# Patient Record
Sex: Female | Born: 1970
Health system: Southern US, Community
[De-identification: ages and names within clinical notes are randomized; demographics above are authoritative.]

## PROBLEM LIST (undated history)

## (undated) HISTORY — PX: WISDOM TOOTH EXTRACTION: SHX21

---

## 2001-03-05 ENCOUNTER — Emergency Department (HOSPITAL_COMMUNITY): Admission: EM | Admit: 2001-03-05 | Discharge: 2001-03-05 | Payer: Self-pay | Admitting: Emergency Medicine

## 2001-04-14 ENCOUNTER — Other Ambulatory Visit: Admission: RE | Admit: 2001-04-14 | Discharge: 2001-04-14 | Payer: Self-pay | Admitting: Obstetrics and Gynecology

## 2001-08-21 ENCOUNTER — Encounter: Payer: Self-pay | Admitting: Obstetrics and Gynecology

## 2001-08-21 ENCOUNTER — Ambulatory Visit (HOSPITAL_COMMUNITY): Admission: RE | Admit: 2001-08-21 | Discharge: 2001-08-21 | Payer: Self-pay | Admitting: Obstetrics and Gynecology

## 2001-12-10 ENCOUNTER — Other Ambulatory Visit: Admission: RE | Admit: 2001-12-10 | Discharge: 2001-12-10 | Payer: Self-pay | Admitting: Obstetrics and Gynecology

## 2002-06-01 ENCOUNTER — Inpatient Hospital Stay (HOSPITAL_COMMUNITY): Admission: AD | Admit: 2002-06-01 | Discharge: 2002-06-01 | Payer: Self-pay | Admitting: Obstetrics and Gynecology

## 2002-06-02 ENCOUNTER — Inpatient Hospital Stay (HOSPITAL_COMMUNITY): Admission: AD | Admit: 2002-06-02 | Discharge: 2002-06-02 | Payer: Self-pay | Admitting: *Deleted

## 2002-07-18 ENCOUNTER — Inpatient Hospital Stay (HOSPITAL_COMMUNITY): Admission: AD | Admit: 2002-07-18 | Discharge: 2002-07-20 | Payer: Self-pay | Admitting: Obstetrics and Gynecology

## 2002-08-16 ENCOUNTER — Other Ambulatory Visit: Admission: RE | Admit: 2002-08-16 | Discharge: 2002-08-16 | Payer: Self-pay | Admitting: Obstetrics and Gynecology

## 2006-06-07 ENCOUNTER — Emergency Department (HOSPITAL_COMMUNITY): Admission: EM | Admit: 2006-06-07 | Discharge: 2006-06-07 | Payer: Self-pay | Admitting: Emergency Medicine

## 2008-08-14 IMAGING — CR DG ABDOMEN ACUTE W/ 1V CHEST
3 series · 3 of 3 positions shown · non-contrast
Comparison: none

CLINICAL DATA: Abdominal  pain. Nausea and vomiting. 
 ACUTE ABDOMINAL SERIES:

[w chest pa]
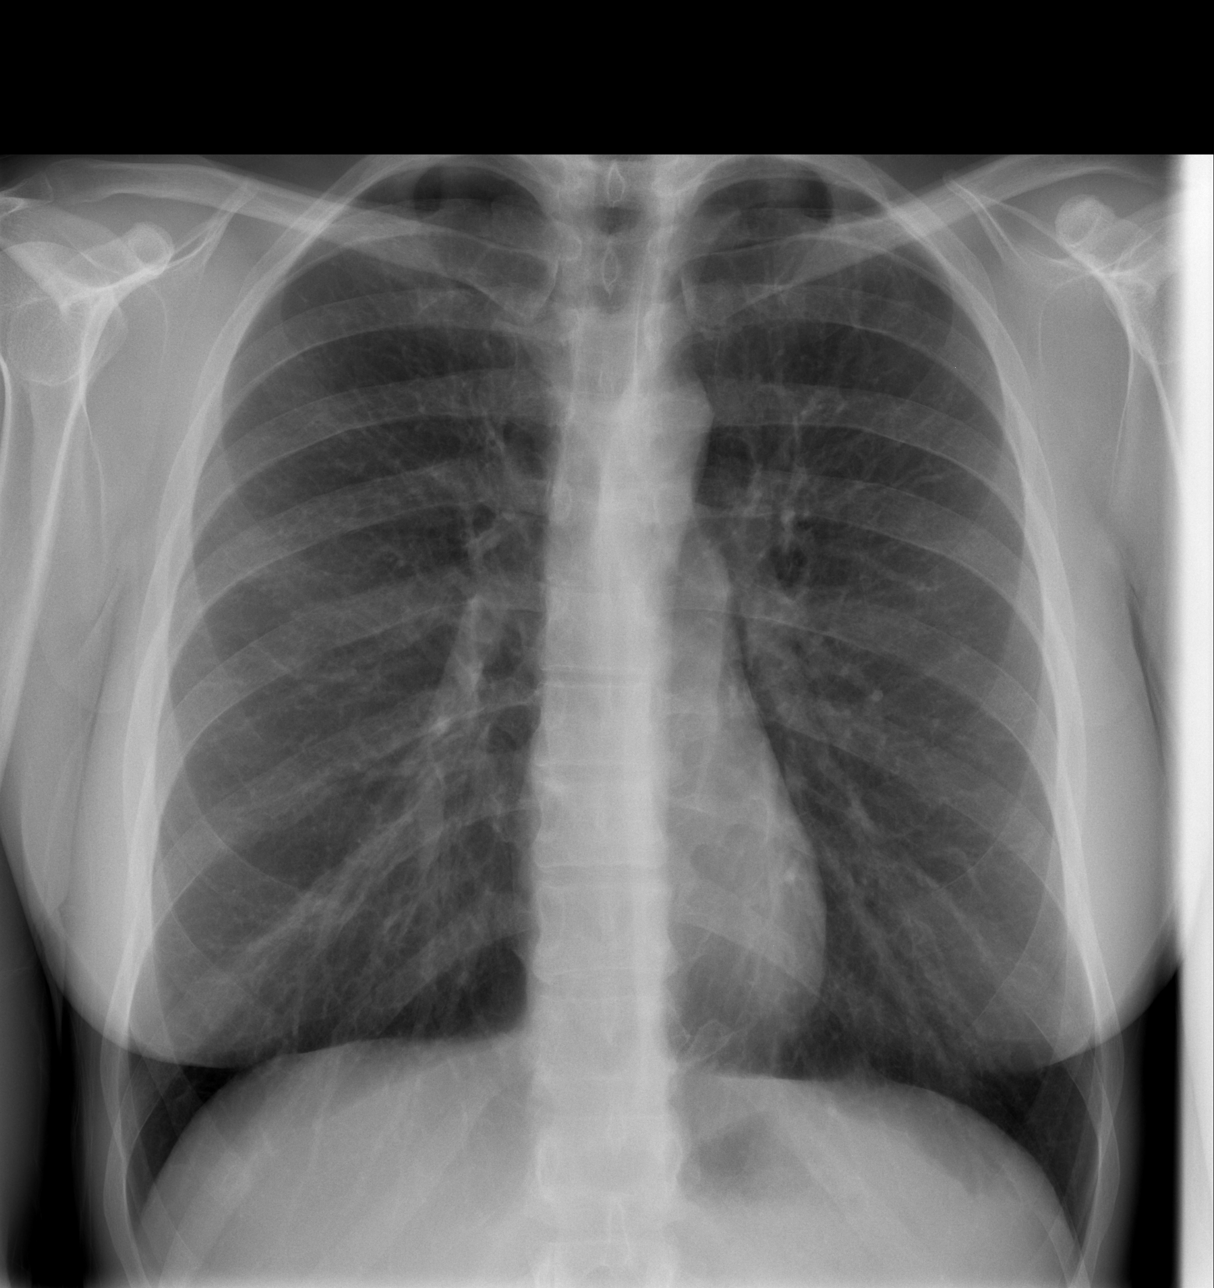

[w abdomen upright]
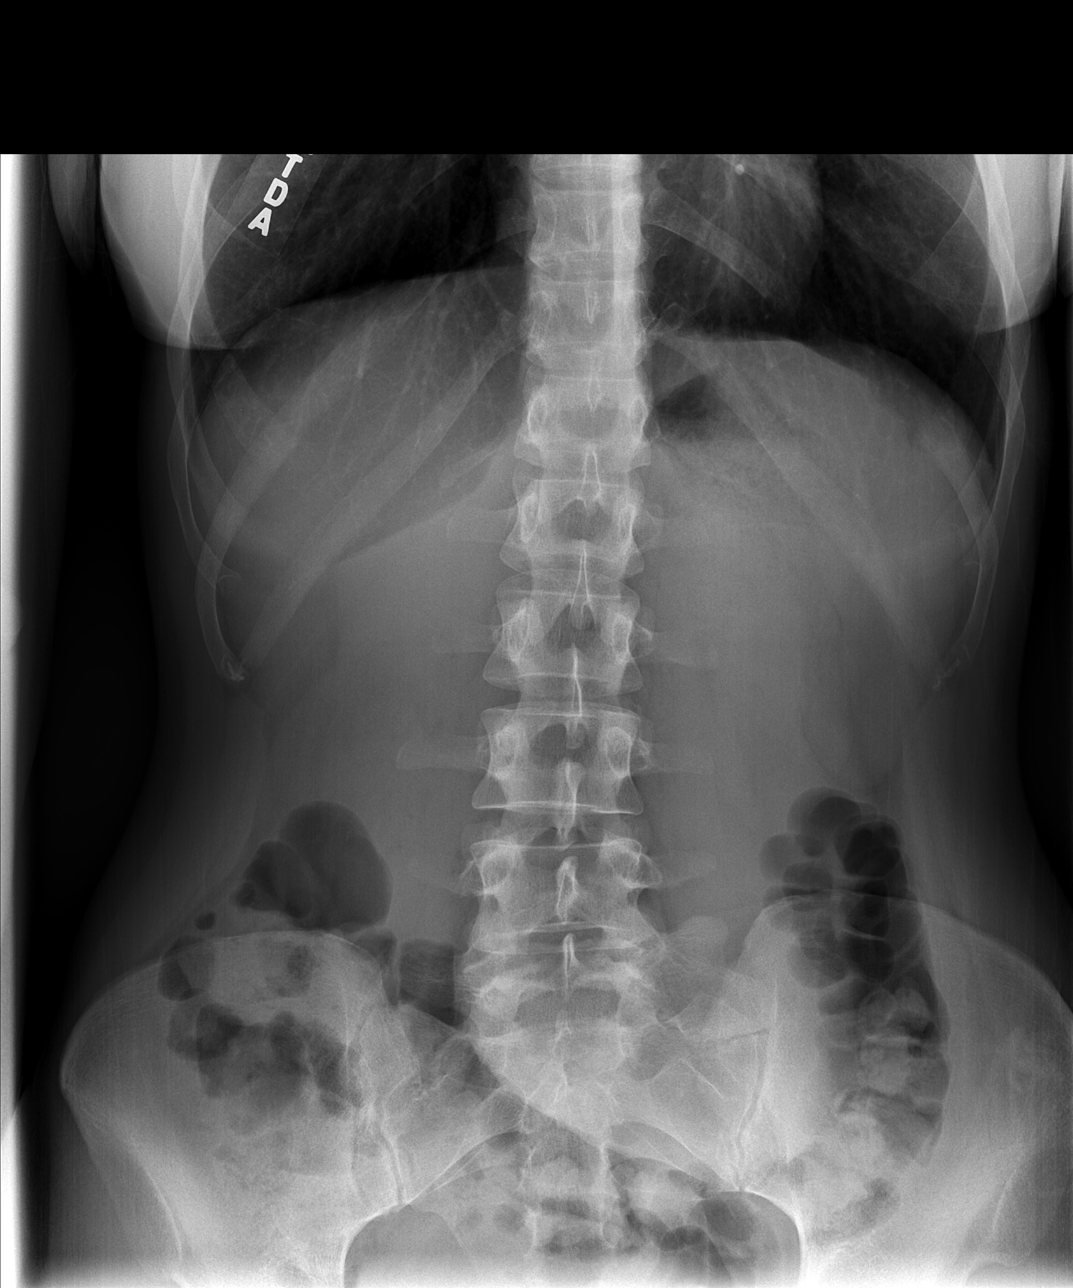

[t abdomen supine]
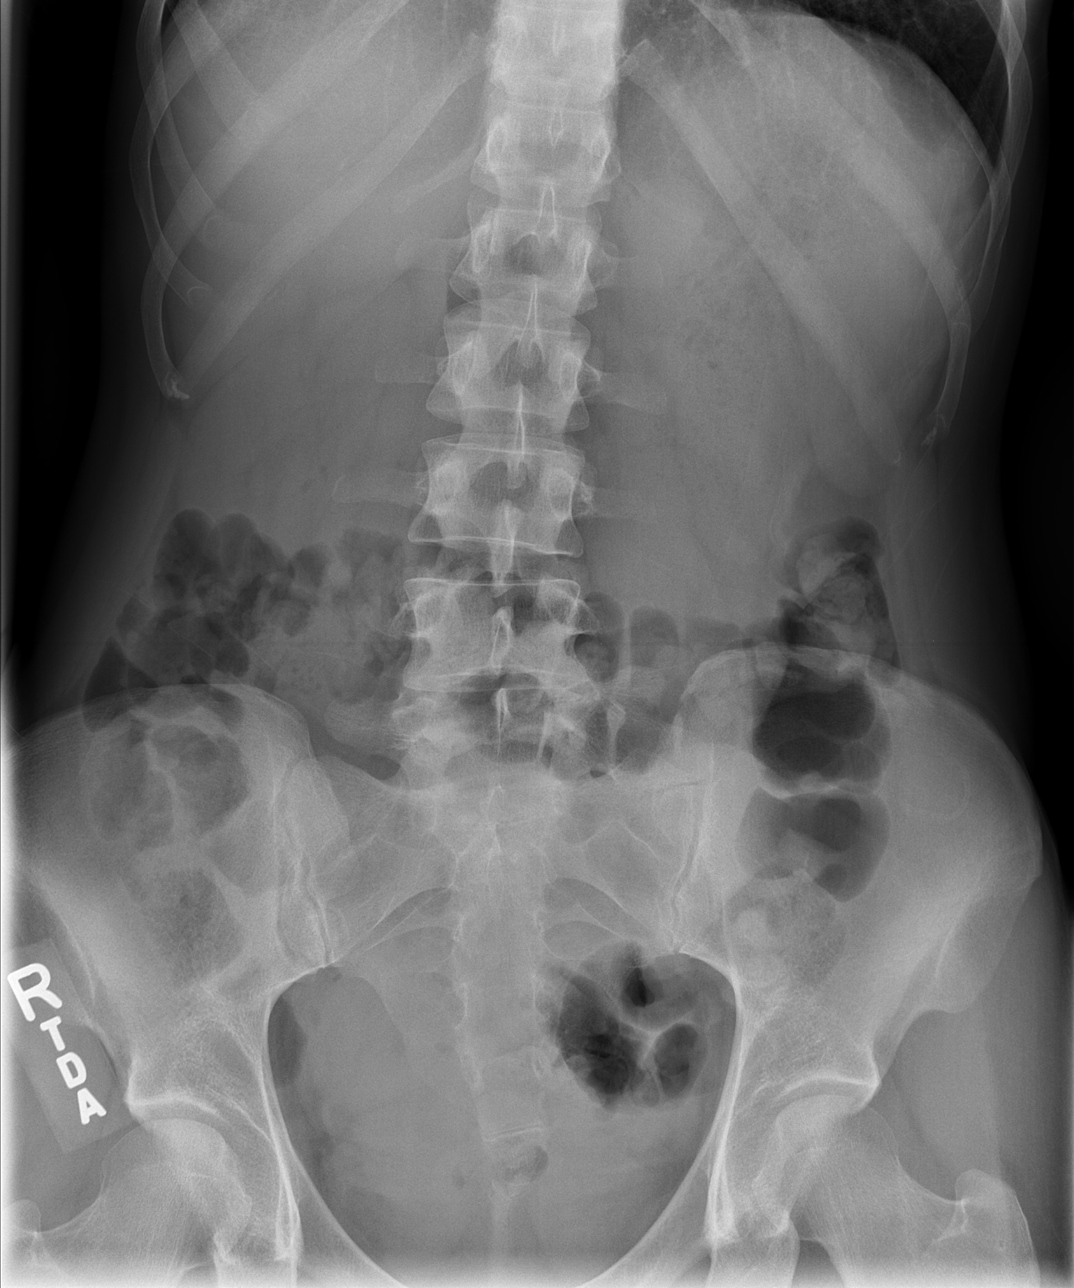

[3 of 3 positions shown; findings below may reference images not displayed]

FINDINGS: There is no evidence of dilated bowel loops or free intraperitoneal air.  No radiopaque calculi or other significant radiographic abnormality is seen.
 Heart size and mediastinal contours are within normal limits.  Both lungs are clear.
IMPRESSION: Negative abdominal radiographs.  No acute cardiopulmonary disease.

## 2010-06-08 NOTE — Op Note (Signed)
   NAME:  Megan Schmidt, Megan Schmidt                          ACCOUNT NO.:  [id number]   MEDICAL RECORD NO.:  [id number]                   PATIENT TYPE:  INP   LOCATION:  9129                                 FACILITY:  [facility]   PHYSICIAN:  Lenoard Aden, M.D.             DATE OF BIRTH:  25-Mar-1970   DATE OF PROCEDURE:  07/18/2002  DATE OF DISCHARGE:                                 OPERATIVE REPORT   OPERATIVE DELIVERY NOTE:   INDICATION FOR OPERATIVE DELIVERY:  Nonreassuring fetal heart rate tracing.   POSTOPERATIVE DIAGNOSIS:  Nonreassuring fetal heart rate tracing.   PROCEDURE:  Outlet vacuum-assisted vaginal delivery.   SURGEON:  Lenoard Aden, M.D.   ANESTHESIA:  Epidural.   ESTIMATED BLOOD LOSS:  700 mL.   DESCRIPTION OF PROCEDURE:  After pushing x45 minutes, severe variable  decelerations were noted in the 70-80 beat per minute range.  The Kiwi cup  is explained, risks of cephalohematoma, scalp laceration, and small  incidence of intracranial hemorrhage discussed with the patient and her  husband, and they comply and wish to proceed with the procedure.  A Kiwi cup  is placed in the proper position for four pulls with one pop-off, of a full-  term living female, Apgars 8 and 10, over a central median episiotomy with  third degree extension and bilateral lateral vaginal wall lacerations,  Apgars 8 and 10 as noted.  The placenta delivered spontaneously intact,  three-vessel cord noted, estimated blood loss as noted was 700 mL.  Repair  with a 3-0 Rapide in a continuous running fashion on each of the lateral  wall lacerations, brought down to the edge of the perineum.  Standard  episiotomy repair was then performed with a 3-0 Rapide and 0 Vicryl placed  in interrupted fashion for sphincter repair in an end-to-end fashion.  Mother and baby tolerated the procedure well.  Patient to recovery in good  condition.                                               Lenoard Aden,  M.D.    RJT/MEDQ  D:  07/18/2002  T:  07/19/2002  Job:  [id number]

## 2010-06-08 NOTE — H&P (Signed)
   NAME:  Megan Schmidt, Megan Schmidt                          ACCOUNT NO.:  [id number]   MEDICAL RECORD NO.:  [id number]                   PATIENT TYPE:  INP   LOCATION:  9167                                 FACILITY:  [facility]   PHYSICIAN:  Lenoard Aden, M.D.             DATE OF BIRTH:  Jan 05, 1971   DATE OF ADMISSION:  07/18/2002  DATE OF DISCHARGE:                                HISTORY & PHYSICAL   CHIEF COMPLAINT:  Postdates induction and presumed LGA.   HISTORY OF PRESENT ILLNESS:  A 40 year old white female, gravida 1, para 0,  EDD July 17, 2002, at 40-1/7 weeks who presents for induction for  aforementioned reasons.   ALLERGIES:  No known drug allergies.   MEDICATIONS:  Prenatal vitamins.   PAST MEDICAL HISTORY:  History of Chlamydia at age 19.  History of  hospitalization for ear infection.   PAST OBSTETRICAL HISTORY:  Noncontributory.   FAMILY HISTORY:  Myocardial infarction and diabetes.   No other medical or surgical hospitalizations noted.   PRENATAL LABORATORY DATA:  Blood type A positive, Rh negative, rubella  immune, hepatitis negative, HIV declined. Pregnancy complicated by preterm  cervical change, no evidence of worsening preterm labor.   PHYSICAL EXAMINATION:  GENERAL: She is a well-developed, well-nourished  white female in no acute distress.  HEENT:  Normal.  LUNGS:  Clear.  HEART:  Regular rate and rhythm.  ABDOMEN:  Soft, gravid, and nontender.  Estimated fetal weight by ultrasound  8-1/2 pounds.  PELVIC:  Cervix is 2 to 3 cm, 80 to 90% effaced, and vertex at -1.  EXTREMITIES:  No cords.  NEUROLOGY:  Nonfocal.   IMPRESSION:  1. 40+ week intrauterine pregnancy.  2. Presumed large for gestational age.    PLAN:  To proceed with attempts at vaginal delivery, Pitocin p.r.n.,  epidural, and IBR narcotics as needed.  Anticipate attempts at vaginal  delivery.                                               Lenoard Aden, M.D.    RJT/MEDQ  D:  07/18/2002   T:  07/18/2002  Job:  [id number]

## 2011-10-30 ENCOUNTER — Other Ambulatory Visit: Payer: Self-pay | Admitting: Obstetrics and Gynecology

## 2011-10-30 DIAGNOSIS — Z1231 Encounter for screening mammogram for malignant neoplasm of breast: Secondary | ICD-10-CM

## 2011-11-26 ENCOUNTER — Ambulatory Visit
Admission: RE | Admit: 2011-11-26 | Discharge: 2011-11-26 | Disposition: A | Discharge status: Home / Self Care | Payer: BC Managed Care – PPO | Source: Ambulatory Visit | Attending: Obstetrics and Gynecology | Admitting: Obstetrics and Gynecology

## 2011-11-26 ENCOUNTER — Other Ambulatory Visit: Payer: Self-pay | Admitting: Obstetrics and Gynecology

## 2011-11-26 DIAGNOSIS — Z1231 Encounter for screening mammogram for malignant neoplasm of breast: Secondary | ICD-10-CM

## 2017-03-10 DIAGNOSIS — Z01419 Encounter for gynecological examination (general) (routine) without abnormal findings: Secondary | ICD-10-CM | POA: Diagnosis not present

## 2017-03-10 DIAGNOSIS — Z1231 Encounter for screening mammogram for malignant neoplasm of breast: Secondary | ICD-10-CM | POA: Diagnosis not present

## 2017-03-10 DIAGNOSIS — Z1321 Encounter for screening for nutritional disorder: Secondary | ICD-10-CM | POA: Diagnosis not present

## 2017-03-10 DIAGNOSIS — Z13220 Encounter for screening for lipoid disorders: Secondary | ICD-10-CM | POA: Diagnosis not present

## 2017-03-10 DIAGNOSIS — Z130 Encounter for screening for diseases of the blood and blood-forming organs and certain disorders involving the immune mechanism: Secondary | ICD-10-CM | POA: Diagnosis not present

## 2017-03-10 DIAGNOSIS — Z6823 Body mass index (BMI) 23.0-23.9, adult: Secondary | ICD-10-CM | POA: Diagnosis not present

## 2017-03-10 DIAGNOSIS — Z131 Encounter for screening for diabetes mellitus: Secondary | ICD-10-CM | POA: Diagnosis not present

## 2017-03-10 DIAGNOSIS — Z1151 Encounter for screening for human papillomavirus (HPV): Secondary | ICD-10-CM | POA: Diagnosis not present

## 2017-03-10 DIAGNOSIS — Z1329 Encounter for screening for other suspected endocrine disorder: Secondary | ICD-10-CM | POA: Diagnosis not present

## 2018-08-10 DIAGNOSIS — J310 Chronic rhinitis: Secondary | ICD-10-CM | POA: Diagnosis not present

## 2018-08-10 DIAGNOSIS — H9202 Otalgia, left ear: Secondary | ICD-10-CM | POA: Diagnosis not present

## 2018-08-10 DIAGNOSIS — J342 Deviated nasal septum: Secondary | ICD-10-CM | POA: Diagnosis not present

## 2018-10-14 DIAGNOSIS — Z1329 Encounter for screening for other suspected endocrine disorder: Secondary | ICD-10-CM | POA: Diagnosis not present

## 2018-10-14 DIAGNOSIS — Z0000 Encounter for general adult medical examination without abnormal findings: Secondary | ICD-10-CM | POA: Diagnosis not present

## 2018-10-14 DIAGNOSIS — Z6821 Body mass index (BMI) 21.0-21.9, adult: Secondary | ICD-10-CM | POA: Diagnosis not present

## 2018-10-14 DIAGNOSIS — Z1231 Encounter for screening mammogram for malignant neoplasm of breast: Secondary | ICD-10-CM | POA: Diagnosis not present

## 2018-10-14 DIAGNOSIS — Z13220 Encounter for screening for lipoid disorders: Secondary | ICD-10-CM | POA: Diagnosis not present

## 2018-10-14 DIAGNOSIS — Z130 Encounter for screening for diseases of the blood and blood-forming organs and certain disorders involving the immune mechanism: Secondary | ICD-10-CM | POA: Diagnosis not present

## 2018-10-14 DIAGNOSIS — Z01419 Encounter for gynecological examination (general) (routine) without abnormal findings: Secondary | ICD-10-CM | POA: Diagnosis not present

## 2018-11-18 DIAGNOSIS — R748 Abnormal levels of other serum enzymes: Secondary | ICD-10-CM | POA: Diagnosis not present

## 2019-03-27 DIAGNOSIS — S92335A Nondisplaced fracture of third metatarsal bone, left foot, initial encounter for closed fracture: Secondary | ICD-10-CM | POA: Diagnosis not present

## 2019-11-02 DIAGNOSIS — Z6821 Body mass index (BMI) 21.0-21.9, adult: Secondary | ICD-10-CM | POA: Diagnosis not present

## 2019-11-02 DIAGNOSIS — Z1231 Encounter for screening mammogram for malignant neoplasm of breast: Secondary | ICD-10-CM | POA: Diagnosis not present

## 2019-11-02 DIAGNOSIS — Z01419 Encounter for gynecological examination (general) (routine) without abnormal findings: Secondary | ICD-10-CM | POA: Diagnosis not present

## 2019-11-02 DIAGNOSIS — R7989 Other specified abnormal findings of blood chemistry: Secondary | ICD-10-CM | POA: Diagnosis not present

## 2020-11-02 ENCOUNTER — Encounter: Payer: Self-pay | Admitting: Internal Medicine

## 2020-12-12 ENCOUNTER — Ambulatory Visit (AMBULATORY_SURGERY_CENTER): Payer: Self-pay

## 2020-12-12 ENCOUNTER — Other Ambulatory Visit: Payer: Self-pay

## 2020-12-12 VITALS — Ht 66.0 in | Wt 124.0 lb

## 2020-12-12 DIAGNOSIS — Z1211 Encounter for screening for malignant neoplasm of colon: Secondary | ICD-10-CM

## 2020-12-12 MED ORDER — NA SULFATE-K SULFATE-MG SULF 17.5-3.13-1.6 GM/177ML PO SOLN: 1.0000 | Freq: Once | ORAL | 0 refills | Status: AC

## 2020-12-12 MED ORDER — NA SULFATE-K SULFATE-MG SULF 17.5-3.13-1.6 GM/177ML PO SOLN: 1.0000 | Freq: Once | ORAL | 0 refills | Status: DC

## 2020-12-12 NOTE — Progress Notes (Signed)
Denies allergies to eggs or soy products. Denies complication of anesthesia or sedation. Denies use of weight loss medication. Denies use of O2.   Emmi instructions given for colonoscopy.  

## 2020-12-19 ENCOUNTER — Encounter: Payer: Self-pay | Admitting: Internal Medicine

## 2020-12-20 ENCOUNTER — Telehealth: Payer: Self-pay | Admitting: Internal Medicine

## 2020-12-20 DIAGNOSIS — Z1211 Encounter for screening for malignant neoplasm of colon: Secondary | ICD-10-CM

## 2020-12-20 MED ORDER — NA SULFATE-K SULFATE-MG SULF 17.5-3.13-1.6 GM/177ML PO SOLN: 1.0000 | ORAL | 0 refills | Status: DC

## 2020-12-20 NOTE — Telephone Encounter (Signed)
Resent suprep to pt's pharmacy and prep instructions sent to pt's email-per pt's request. Pt is aware.

## 2020-12-26 ENCOUNTER — Other Ambulatory Visit: Payer: Self-pay

## 2020-12-26 ENCOUNTER — Encounter: Payer: Self-pay | Admitting: Internal Medicine

## 2020-12-26 ENCOUNTER — Ambulatory Visit (AMBULATORY_SURGERY_CENTER): Payer: BC Managed Care – PPO | Admitting: Internal Medicine

## 2020-12-26 VITALS — BP 107/77 | HR 65 | Temp 97.3°F | Resp 14 | Ht 66.0 in | Wt 124.0 lb

## 2020-12-26 DIAGNOSIS — Z1211 Encounter for screening for malignant neoplasm of colon: Secondary | ICD-10-CM | POA: Diagnosis present

## 2020-12-26 MED ORDER — SODIUM CHLORIDE 0.9 % IV SOLN: 500.0000 mL | Freq: Once | INTRAVENOUS | Status: AC

## 2020-12-26 NOTE — Op Note (Signed)
[facility] Patient Name: Megan Schmidt Procedure Date: 12/26/2020 10:46 AM MRN: [id number] Endoscopist: Beverley Fiedler , MD Age: 50 Referring MD:  Date of Birth: Feb 07, 1970 Gender: Female Account #: [id number] Procedure:                Colonoscopy Indications:              Screening for colorectal malignant neoplasm Medicines:                Monitored Anesthesia Care Procedure:                Pre-Anesthesia Assessment:                           - Prior to the procedure, a History and Physical                            was performed, and patient medications and                            allergies were reviewed. The patient's tolerance of                            previous anesthesia was also reviewed. The risks                            and benefits of the procedure and the sedation                            options and risks were discussed with the patient.                            All questions were answered, and informed consent                            was obtained. Prior Anticoagulants: The patient has                            taken no previous anticoagulant or antiplatelet                            agents. ASA Grade Assessment: I - A normal, healthy                            patient. After reviewing the risks and benefits,                            the patient was deemed in satisfactory condition to                            undergo the procedure.                           After obtaining informed consent, the colonoscope  was passed under direct vision. Throughout the                            procedure, the patient's blood pressure, pulse, and                            oxygen saturations were monitored continuously. The                            PCF-HQ190L Colonoscope was introduced through the                            anus and advanced to the cecum, identified by                            appendiceal orifice and ileocecal  valve. The                            colonoscopy was performed without difficulty. The                            patient tolerated the procedure well. The quality                            of the bowel preparation was excellent. The                            ileocecal valve, appendiceal orifice, and rectum                            were photographed. Scope In: 11:01:23 AM Scope Out: 11:13:06 AM Scope Withdrawal Time: 0 hours 7 minutes 56 seconds  Total Procedure Duration: 0 hours 11 minutes 43 seconds  Findings:                 The digital rectal exam was normal.                           The colon (entire examined portion) appeared normal.                           Internal hemorrhoids were found during                            retroflexion. The hemorrhoids were small. Complications:            No immediate complications. Estimated Blood Loss:     Estimated blood loss: none. Impression:               - The entire examined colon is normal.                           - Small internal hemorrhoids.                           - No specimens collected. Recommendation:           -  Patient has a contact number available for                            emergencies. The signs and symptoms of potential                            delayed complications were discussed with the                            patient. Return to normal activities tomorrow.                            Written discharge instructions were provided to the                            patient.                           - Resume previous diet.                           - Continue present medications.                           - Repeat colonoscopy in 10 years for screening                            purposes. Beverley Fiedler, MD 12/26/2020 11:16:22 AM This report has been signed electronically.

## 2020-12-26 NOTE — Patient Instructions (Signed)
YOU HAD AN ENDOSCOPIC PROCEDURE TODAY AT THE  ENDOSCOPY CENTER:   Refer to the procedure report that was given to you for any specific questions about what was found during the examination.  If the procedure report does not answer your questions, please call your gastroenterologist to clarify.  If you requested that your care partner not be given the details of your procedure findings, then the procedure report has been included in a sealed envelope for you to review at your convenience later.  YOU SHOULD EXPECT: Some feelings of bloating in the abdomen. Passage of more gas than usual.  Walking can help get rid of the air that was put into your GI tract during the procedure and reduce the bloating. If you had a lower endoscopy (such as a colonoscopy or flexible sigmoidoscopy) you may notice spotting of blood in your stool or on the toilet paper. If you underwent a bowel prep for your procedure, you may not have a normal bowel movement for a few days.  Please Note:  You might notice some irritation and congestion in your nose or some drainage.  This is from the oxygen used during your procedure.  There is no need for concern and it should clear up in a day or so.  SYMPTOMS TO REPORT IMMEDIATELY:  Following lower endoscopy (colonoscopy or flexible sigmoidoscopy):  Excessive amounts of blood in the stool  Significant tenderness or worsening of abdominal pains  Swelling of the abdomen that is new, acute  Fever of 100F or higher   For urgent or emergent issues, a gastroenterologist can be reached at any hour by calling [phone]. Do not use MyChart messaging for urgent concerns.    DIET:  We do recommend a small meal at first, but then you may proceed to your regular diet.  Drink plenty of fluids but you should avoid alcoholic beverages for 24 hours.  MEDICATIONS:  Continue present medications.  Please see handouts given to you by your recovery nurse.  Thank you for allowing us to  provide for your healthcare needs today.  ACTIVITY:  You should plan to take it easy for the rest of today and you should NOT DRIVE or use heavy machinery until tomorrow (because of the sedation medicines used during the test).    FOLLOW UP: Our staff will call the number listed on your records 48-72 hours following your procedure to check on you and address any questions or concerns that you may have regarding the information given to you following your procedure. If we do not reach you, we will leave a message.  We will attempt to reach you two times.  During this call, we will ask if you have developed any symptoms of COVID 19. If you develop any symptoms (ie: fever, flu-like symptoms, shortness of breath, cough etc.) before then, please call [phone].  If you test positive for Covid 19 in the 2 weeks post procedure, please call and report this information to us.    If any biopsies were taken you will be contacted by phone or by letter within the next 1-3 weeks.  Please call us at [phone] if you have not heard about the biopsies in 3 weeks.    SIGNATURES/CONFIDENTIALITY: You and/or your care partner have signed paperwork which will be entered into your electronic medical record.  These signatures attest to the fact that that the information above on your After Visit Summary has been reviewed and is understood.  Full responsibility of the   confidentiality of this discharge information lies with you and/or your care-partner.  

## 2020-12-26 NOTE — Progress Notes (Signed)
   GASTROENTEROLOGY PROCEDURE H&P NOTE   Primary Care Physician: Pcp, No    Reason for Procedure:  Colorectal cancer screening  Plan:    Colonoscopy  Patient is appropriate for endoscopic procedure(s) in the ambulatory ([facility]) setting.  The nature of the procedure, as well as the risks, benefits, and alternatives were carefully and thoroughly reviewed with the patient. Ample time for discussion and questions allowed. The patient understood, was satisfied, and agreed to proceed.     HPI: Megan Schmidt is a 50 y.o. female who presents for screening colonoscopy.  First exam.  Tolerated the prep.  No known family history of colorectal cancer.  No recent chest pain or shortness of breath.  No abdominal pain today.  History reviewed. No pertinent past medical history.  Past Surgical History:  Procedure Laterality Date   WISDOM TOOTH EXTRACTION      Prior to Admission medications   Medication Sig Start Date End Date Taking? Authorizing Provider  Multiple Vitamin (MULTIVITAMIN) tablet Take 1 tablet by mouth daily.   Yes [provider]  Vitamin D, Ergocalciferol, (DRISDOL) 1.25 MG (50000 UNIT) CAPS capsule Take 50,000 Units by mouth every 7 (seven) days.   Yes [provider]    Current Outpatient Medications  Medication Sig Dispense Refill   Multiple Vitamin (MULTIVITAMIN) tablet Take 1 tablet by mouth daily.     Vitamin D, Ergocalciferol, (DRISDOL) 1.25 MG (50000 UNIT) CAPS capsule Take 50,000 Units by mouth every 7 (seven) days.     No current facility-administered medications for this visit.    Allergies as of 12/26/2020   (No Known Allergies)    Family History  Problem Relation Age of Onset   Colon cancer Neg Hx    Esophageal cancer Neg Hx    Rectal cancer Neg Hx    Stomach cancer Neg Hx     Social History   Socioeconomic History   Marital status: Married    Spouse name: Not on file   Number of children: Not on file   Years of education: Not  on file   Highest education level: Not on file  Occupational History   Not on file  Tobacco Use   Smoking status: Never   Smokeless tobacco: Never  Substance and Sexual Activity   Alcohol use: Never   Drug use: Never   Sexual activity: Not on file  Other Topics Concern   Not on file  Social History Narrative   Not on file   Social Determinants of Health   Financial Resource Strain: Not on file  Food Insecurity: Not on file  Transportation Needs: Not on file  Physical Activity: Not on file  Stress: Not on file  Social Connections: Not on file  Intimate Partner Violence: Not on file    Physical Exam: Vital signs in last 24 hours: @BP  92/64 (BP Location: Right Arm, Patient Position: Sitting, Cuff Size: Normal)   Pulse 73   Temp (!) 97.3 F (36.3 C) (Temporal)   Ht 5\' 6"  (1.676 m)   Wt 124 lb (56.2 kg)   LMP 11/27/2020   SpO2 100%   BMI 20.01 kg/m  GEN: NAD EYE: Sclerae anicteric ENT: MMM CV: Non-tachycardic Pulm: CTA b/l GI: Soft, NT/ND NEURO:  Alert & Oriented x 3   , MD [facility]  12/26/2020 10:50 AM

## 2020-12-26 NOTE — Progress Notes (Signed)
Report to PACU, RN, vss, BBS= Clear.  

## 2020-12-26 NOTE — Progress Notes (Signed)
Vitals-[facility]  History reviewed. 

## 2020-12-28 ENCOUNTER — Telehealth: Payer: Self-pay | Admitting: *Deleted

## 2020-12-28 ENCOUNTER — Telehealth: Payer: Self-pay

## 2020-12-28 NOTE — Telephone Encounter (Signed)
  Follow up Call-  Call back number 12/26/2020  Post procedure Call Back phone  # [phone]  Permission to leave phone message Yes  Some recent data might be hidden     No answer at 2nd attempt follow up phone call.  Left message on voicemail.

## 2020-12-28 NOTE — Telephone Encounter (Signed)
TC to patient, VM obtained and message left. SChaplin, RN,BSN
